# Patient Record
Sex: Male | Born: 1985 | Race: White | Hispanic: No | Marital: Married | Smoking: Never smoker
Health system: Southern US, Community
[De-identification: ages and names within clinical notes are randomized; demographics above are authoritative.]

---

## 2003-03-06 ENCOUNTER — Ambulatory Visit (HOSPITAL_COMMUNITY): Admission: RE | Admit: 2003-03-06 | Discharge: 2003-03-06 | Payer: Self-pay | Admitting: Family Medicine

## 2003-03-06 ENCOUNTER — Encounter: Payer: Self-pay | Admitting: Family Medicine

## 2010-08-01 ENCOUNTER — Emergency Department (HOSPITAL_COMMUNITY)
Admission: EM | Admit: 2010-08-01 | Discharge: 2010-08-02 | Payer: Self-pay | Source: Home / Self Care | Admitting: Emergency Medicine

## 2011-02-04 LAB — COMPREHENSIVE METABOLIC PANEL
Albumin: 4.4 g/dL (ref 3.5–5.2)
Alkaline Phosphatase: 73 U/L (ref 39–117)
BUN: 15 mg/dL (ref 6–23)
Creatinine, Ser: 1.4 mg/dL (ref 0.4–1.5)
Glucose, Bld: 106 mg/dL — ABNORMAL HIGH (ref 70–99)
Potassium: 3.6 mEq/L (ref 3.5–5.1)
Total Bilirubin: 0.5 mg/dL (ref 0.3–1.2)
Total Protein: 6.9 g/dL (ref 6.0–8.3)

## 2011-02-04 LAB — DIFFERENTIAL
Basophils Absolute: 0.1 10*3/uL (ref 0.0–0.1)
Basophils Relative: 0 % (ref 0–1)
Lymphocytes Relative: 18 % (ref 12–46)
Neutro Abs: 9.3 10*3/uL — ABNORMAL HIGH (ref 1.7–7.7)
Neutrophils Relative %: 69 % (ref 43–77)

## 2011-02-04 LAB — CBC
HCT: 44.4 % (ref 39.0–52.0)
MCH: 31 pg (ref 26.0–34.0)
MCV: 88.2 fL (ref 78.0–100.0)
Platelets: 198 10*3/uL (ref 150–400)
RDW: 12.5 % (ref 11.5–15.5)
WBC: 13.5 10*3/uL — ABNORMAL HIGH (ref 4.0–10.5)

## 2011-02-04 LAB — URINALYSIS, ROUTINE W REFLEX MICROSCOPIC
Bilirubin Urine: NEGATIVE
Glucose, UA: NEGATIVE mg/dL
Hgb urine dipstick: NEGATIVE
Ketones, ur: NEGATIVE mg/dL
Protein, ur: NEGATIVE mg/dL
Urobilinogen, UA: 0.2 mg/dL (ref 0.0–1.0)

## 2012-10-23 ENCOUNTER — Ambulatory Visit: Payer: Self-pay | Admitting: Internal Medicine

## 2012-10-30 ENCOUNTER — Ambulatory Visit: Payer: Self-pay | Admitting: Internal Medicine

## 2013-11-17 ENCOUNTER — Emergency Department (HOSPITAL_BASED_OUTPATIENT_CLINIC_OR_DEPARTMENT_OTHER)
Admission: EM | Admit: 2013-11-17 | Discharge: 2013-11-17 | Disposition: A | Discharge status: Home / Self Care | Payer: BC Managed Care – PPO | Attending: Emergency Medicine | Admitting: Emergency Medicine

## 2013-11-17 ENCOUNTER — Encounter (HOSPITAL_BASED_OUTPATIENT_CLINIC_OR_DEPARTMENT_OTHER): Payer: Self-pay | Admitting: Emergency Medicine

## 2013-11-17 DIAGNOSIS — Y929 Unspecified place or not applicable: Secondary | ICD-10-CM | POA: Insufficient documentation

## 2013-11-17 DIAGNOSIS — Z79899 Other long term (current) drug therapy: Secondary | ICD-10-CM | POA: Insufficient documentation

## 2013-11-17 DIAGNOSIS — Y990 Civilian activity done for income or pay: Secondary | ICD-10-CM | POA: Insufficient documentation

## 2013-11-17 DIAGNOSIS — S61209A Unspecified open wound of unspecified finger without damage to nail, initial encounter: Secondary | ICD-10-CM | POA: Insufficient documentation

## 2013-11-17 DIAGNOSIS — W260XXA Contact with knife, initial encounter: Secondary | ICD-10-CM | POA: Insufficient documentation

## 2013-11-17 DIAGNOSIS — S61219A Laceration without foreign body of unspecified finger without damage to nail, initial encounter: Secondary | ICD-10-CM

## 2013-11-17 DIAGNOSIS — Z880 Allergy status to penicillin: Secondary | ICD-10-CM | POA: Insufficient documentation

## 2013-11-17 DIAGNOSIS — Y9389 Activity, other specified: Secondary | ICD-10-CM | POA: Insufficient documentation

## 2013-11-17 MED ORDER — BACITRACIN 500 UNIT/GM EX OINT
1.0000 "application " | TOPICAL_OINTMENT | Freq: Two times a day (BID) | CUTANEOUS | Status: DC
  Administered 2013-11-17: 1 via TOPICAL
  Filled 2013-11-17: qty 0.9

## 2013-11-17 NOTE — ED Notes (Signed)
Pt works for [employer] and cut left index finger with a pocket knife. CMS intact- states he does need UDS for worker's comp

## 2013-11-17 NOTE — ED Provider Notes (Signed)
CSN: [id number]     Arrival date & time 11/17/13  1827 History   First MD Initiated Contact with Patient 11/17/13 2130     Chief Complaint  Patient presents with  . Extremity Laceration   (Consider location/radiation/quality/duration/timing/severity/associated sxs/prior Treatment) Patient is a 27 y.o. male presenting with skin laceration.  Laceration Location:  Finger Finger laceration location:  L index finger Length (cm):  3 cm Depth:  Through dermis Quality: straight   Time since incident: just prior to arrival to the ED. Laceration mechanism:  Knife Foreign body present:  No foreign bodies Relieved by:  Pressure Worsened by:  Movement Tetanus status:  Up to date  .namae is a 27 y.o. male who presents to the ED with a laceration to the left index finger. He was using his pocket knife to cut a wire and cut his finger.   History reviewed. No pertinent past medical history. History reviewed. No pertinent past surgical history. No family history on file. History  Substance Use Topics  . Smoking status: Never Smoker   . Smokeless tobacco: Never Used  . Alcohol Use: 0.6 oz/week    1 Cans of beer per week    Review of Systems  Allergies  Amoxicillin  Home Medications   Current Outpatient Rx  Name  Route  Sig  Dispense  Refill  . divalproex (DEPAKOTE) 500 MG DR tablet   Oral   Take 500 mg by mouth 3 (three) times daily.         . meloxicam (MOBIC) 15 MG tablet   Oral   Take 15 mg by mouth as needed for pain.          BP 137/72  Pulse 73  Temp(Src) 98.7 F (37.1 C) (Oral)  Resp 20  Ht 5\' 8"  (1.727 m)  Wt 220 lb (99.791 kg)  BMI 33.46 kg/m2  SpO2 98% Physical Exam  Nursing note and vitals reviewed. Constitutional: He is oriented to person, place, and time. He appears well-developed and well-nourished.  HENT:  Head: Normocephalic and atraumatic.  Eyes: EOM are normal.  Neck: Neck supple.  Cardiovascular: Normal rate.   Pulmonary/Chest: Effort normal.   Abdominal: Soft. There is no tenderness.  Musculoskeletal: Normal range of motion.       Left hand: He exhibits tenderness and laceration. He exhibits normal range of motion, normal capillary refill, no deformity and no swelling. Normal sensation noted. Normal strength noted.       Hands: Neurological: He is alert and oriented to person, place, and time. No cranial nerve deficit.  Skin: Skin is warm and dry.  Psychiatric: He has a normal mood and affect. His behavior is normal.    ED Course  Procedures LACERATION REPAIR Performed by: NEESE,HOPE Authorized by: NEESE,HOPE Consent: Verbal consent obtained. Risks and benefits: risks, benefits and alternatives were discussed Consent given by: patient Patient identity confirmed: provided demographic data Prepped and Draped in normal sterile fashion Wound explored  Laceration Location: left index finger  Laceration Length: 3cm  No Foreign Bodies seen or palpated  Anesthesia: local infiltration  Local anesthetic: lidocaine 1% without epinephrine  Anesthetic total: 3 ml  Irrigation method: syringe Amount of cleaning: standard  Skin closure: 5-0 prolene  Number of sutures: 4  Technique: interrupted  Patient tolerance: Patient tolerated the procedure well with no immediate complications.  MDM  27 y.o. male with laceration to his left index finger. Injury from his pocket knife. Laceration repaired without difficulty. Up to date on tetanus. He  will follow up with his PCP in 7 days for suture removal or return here for any problems.  Discussed with the patient and all questioned fully answered.    Medication List    ASK your doctor about these medications       divalproex 500 MG DR tablet  Commonly known as:  DEPAKOTE  Take 500 mg by mouth 3 (three) times daily.     meloxicam 15 MG tablet  Commonly known as:  MOBIC  Take 15 mg by mouth as needed for pain.          Hope Orlene Och, NP 11/21/13 1500

## 2013-11-26 NOTE — ED Provider Notes (Signed)
Medical screening examination/treatment/procedure(s) were performed by non-physician practitioner and as supervising physician I was immediately available for consultation/collaboration.  EKG Interpretation   None         Gavin PoundMichael Y. Amaiya Scruton, MD 11/26/13 1255

## 2014-07-22 ENCOUNTER — Ambulatory Visit (INDEPENDENT_AMBULATORY_CARE_PROVIDER_SITE_OTHER): Payer: BC Managed Care – PPO | Admitting: Internal Medicine

## 2014-07-22 ENCOUNTER — Ambulatory Visit (INDEPENDENT_AMBULATORY_CARE_PROVIDER_SITE_OTHER): Payer: BC Managed Care – PPO

## 2014-07-22 VITALS — BP 122/64 | HR 107 | Temp 102.6°F | Resp 17 | Ht 68.5 in | Wt 214.0 lb

## 2014-07-22 DIAGNOSIS — R52 Pain, unspecified: Secondary | ICD-10-CM

## 2014-07-22 DIAGNOSIS — R509 Fever, unspecified: Secondary | ICD-10-CM

## 2014-07-22 DIAGNOSIS — J029 Acute pharyngitis, unspecified: Secondary | ICD-10-CM

## 2014-07-22 DIAGNOSIS — R109 Unspecified abdominal pain: Secondary | ICD-10-CM

## 2014-07-22 DIAGNOSIS — R10A1 Flank pain, right side: Secondary | ICD-10-CM

## 2014-07-22 LAB — POCT CBC
Granulocyte percent: 82.6 %G — AB (ref 37–80)
HCT, POC: 46.7 % (ref 43.5–53.7)
Hemoglobin: 15.2 g/dL (ref 14.1–18.1)
Lymph, poc: 0.9 (ref 0.6–3.4)
MCH, POC: 28.9 pg (ref 27–31.2)
MCHC: 32.6 g/dL (ref 31.8–35.4)
MCV: 88.6 fL (ref 80–97)
MID (CBC): 1.2 — AB (ref 0–0.9)
MPV: 7.7 fL (ref 0–99.8)
PLATELET COUNT, POC: 169 10*3/uL (ref 142–424)
POC Granulocyte: 10.2 — AB (ref 2–6.9)
POC LYMPH %: 7.3 % — AB (ref 10–50)
POC MID %: 10.1 % (ref 0–12)
RBC: 5.27 M/uL (ref 4.69–6.13)
RDW, POC: 12.9 %
WBC: 12.3 10*3/uL — AB (ref 4.6–10.2)

## 2014-07-22 LAB — POCT URINALYSIS DIPSTICK
Bilirubin, UA: NEGATIVE
Blood, UA: NEGATIVE
Glucose, UA: NEGATIVE
Ketones, UA: NEGATIVE
LEUKOCYTES UA: NEGATIVE
NITRITE UA: NEGATIVE
PROTEIN UA: NEGATIVE
Spec Grav, UA: 1.015
Urobilinogen, UA: 0.2
pH, UA: 5.5

## 2014-07-22 LAB — COMPREHENSIVE METABOLIC PANEL
ALK PHOS: 86 U/L (ref 39–117)
ALT: 23 U/L (ref 0–53)
AST: 22 U/L (ref 0–37)
Albumin: 4.4 g/dL (ref 3.5–5.2)
BILIRUBIN TOTAL: 0.5 mg/dL (ref 0.2–1.2)
BUN: 12 mg/dL (ref 6–23)
CO2: 24 mEq/L (ref 19–32)
CREATININE: 1 mg/dL (ref 0.50–1.35)
Calcium: 9.6 mg/dL (ref 8.4–10.5)
Chloride: 100 mEq/L (ref 96–112)
Glucose, Bld: 117 mg/dL — ABNORMAL HIGH (ref 70–99)
Potassium: 4.3 mEq/L (ref 3.5–5.3)
Sodium: 134 mEq/L — ABNORMAL LOW (ref 135–145)
Total Protein: 6.5 g/dL (ref 6.0–8.3)

## 2014-07-22 LAB — POCT UA - MICROSCOPIC ONLY
Bacteria, U Microscopic: NEGATIVE
Casts, Ur, LPF, POC: NEGATIVE
Crystals, Ur, HPF, POC: NEGATIVE
Mucus, UA: NEGATIVE
RBC, urine, microscopic: NEGATIVE
YEAST UA: NEGATIVE

## 2014-07-22 LAB — LIPASE: Lipase: 18 U/L (ref 0–75)

## 2014-07-22 LAB — POCT RAPID STREP A (OFFICE): Rapid Strep A Screen: NEGATIVE

## 2014-07-22 MED ORDER — CEFTRIAXONE SODIUM 1 G IJ SOLR
1.0000 g | Freq: Once | INTRAMUSCULAR | Status: AC
  Administered 2014-07-22: 1 g via INTRAMUSCULAR

## 2014-07-22 MED ORDER — DOXYCYCLINE HYCLATE 100 MG PO TABS: 100.0000 mg | ORAL_TABLET | Freq: Two times a day (BID) | ORAL | Status: AC

## 2014-07-22 NOTE — Progress Notes (Signed)
Subjective:    Patient ID: Michael Spence, male    DOB: 07-24-1986, 28 y.o.   MRN: [id number]  HPI 28 year old male here for headache, fatigue, chills, and fever. Has been having right sided mid back pain for the past couple weeks; has become worse within last couple days. Around 9:30 this morning he started running a fever and having chills. He feels weak. His headache started about two nights ago. It is hard for him to take a deep breath. His throat started hurting today. No cough. No problems with urination. Body aches for past couple days. Has not been around anyone sick. No swollen joints. No aching joints.  No ticks seen but has a dog. Has not been in parks and woods, not hunting or fishing. No exposure to any one sick.  Review of Systems  Constitutional: Positive for fever and fatigue.  HENT: Positive for sore throat.   Respiratory: Positive for shortness of breath.   Cardiovascular: Negative.   Gastrointestinal: Positive for abdominal pain. Negative for nausea, diarrhea and abdominal distention.  Endocrine: Negative.   Genitourinary: Positive for flank pain. Negative for urgency, frequency, hematuria, decreased urine volume and testicular pain.  Musculoskeletal: Negative.   Skin: Negative.   Allergic/Immunologic: Negative.   Neurological: Positive for headaches. Negative for dizziness, tremors, speech difficulty and weakness.  Hematological: Negative.   Psychiatric/Behavioral: Negative.        Objective:   Physical Exam  Constitutional: He is oriented to person, place, and time. He appears well-developed and well-nourished. No distress.  HENT:  Head: Normocephalic.  Right Ear: External ear normal.  Left Ear: External ear normal.  Nose: Nose normal.  Mouth/Throat: Oropharynx is clear and moist.  Eyes: Conjunctivae and EOM are normal. Pupils are equal, round, and reactive to light. No scleral icterus.  Neck: Normal range of motion. Neck supple. No tracheal deviation  present. No thyromegaly present.  Cardiovascular: Normal rate, regular rhythm, normal heart sounds and intact distal pulses.   Pulmonary/Chest: Effort normal and breath sounds normal.  Abdominal: Soft. Bowel sounds are normal. He exhibits no mass. There is tenderness. There is no rebound and no guarding.  Genitourinary: Penis normal.  Musculoskeletal: Normal range of motion.  Lymphadenopathy:    He has cervical adenopathy.  Neurological: He is alert and oriented to person, place, and time. No cranial nerve deficit. He exhibits normal muscle tone. Coordination normal.  Skin: No rash noted.  Psychiatric: He has a normal mood and affect. His behavior is normal. Judgment and thought content normal.     Results for orders placed in visit on 07/22/14  POCT CBC      Result Value Ref Range   WBC 12.3 (*) 4.6 - 10.2 K/uL   Lymph, poc 0.9  0.6 - 3.4   POC LYMPH PERCENT 7.3 (*) 10 - 50 %L   MID (cbc) 1.2 (*) 0 - 0.9   POC MID % 10.1  0 - 12 %M   POC Granulocyte 10.2 (*) 2 - 6.9   Granulocyte percent 82.6 (*) 37 - 80 %G   RBC 5.27  4.69 - 6.13 M/uL   Hemoglobin 15.2  14.1 - 18.1 g/dL   HCT, POC 40.9  81.1 - 53.7 %   MCV 88.6  80 - 97 fL   MCH, POC 28.9  27 - 31.2 pg   MCHC 32.6  31.8 - 35.4 g/dL   RDW, POC 91.4     Platelet Count, POC 169  142 - 424  K/uL   MPV 7.7  0 - 99.8 fL  POCT RAPID STREP A (OFFICE)      Result Value Ref Range   Rapid Strep A Screen Negative  Negative  POCT UA - MICROSCOPIC ONLY      Result Value Ref Range   WBC, Ur, HPF, POC 0-1     RBC, urine, microscopic neg     Bacteria, U Microscopic neg     Mucus, UA neg     Epithelial cells, urine per micros 0-1     Crystals, Ur, HPF, POC neg     Casts, Ur, LPF, POC neg     Yeast, UA neg    POCT URINALYSIS DIPSTICK      Result Value Ref Range   Color, UA dark yellow     Clarity, UA clear     Glucose, UA neg     Bilirubin, UA neg     Ketones, UA neg     Spec Grav, UA 1.015     Blood, UA neg     pH, UA 5.5      Protein, UA neg     Urobilinogen, UA 0.2     Nitrite, UA neg     Leukocytes, UA Negative     [facility] Policy for Prescribing Controlled Substances (Revised 09/2012) 1. Prescriptions for controlled substances will be filled by ONE provider at [facility] with whom you have established and developed a plan for your care, including follow-up. 2. You are encouraged to schedule an appointment with your prescriber at our appointment center for follow-up visits whenever possible. 3. If you request a prescription for the controlled substance while at [facility] for an acute problem (with someone other than your regular prescriber), you MAY be given a ONE-TIME prescription for a 30-day supply of the controlled substance, to allow time for you to return to see your regular prescriber for additional prescriptions.  [facility] reading (PRIMARY) by  Dr.Normal CXR Dr. Perrin Maltese        Assessment & Plan:  FeverHA/Body aches cause unclear Rocephin 1 g/Doxycycline  bid Return 24-48 hrs reasess

## 2014-07-22 NOTE — Patient Instructions (Signed)
Ehrlichiosis and Anaplasmosis Ehrlichiosis and anaplasmosis are diseases caused by bacteria and carried by ticks. Other names for these infections are:  Human monocytic ehrlichiosis (HME).  Human granulocytotropic anaplasmosis (HGA). HME mostly occurs in the [place] and [place], where the lone star tick lives. However, infections have occurred in 72 states. HGA infections are limited to fewer geographic locations. Most cases are reported from [place], [place], [place], [place], and [place]. This distribution is almost identical to that of Lyme disease because of the shared species of ixodid ticks (wood ticks, deer ticks). CAUSES   HME is caused by Ehrlichia chaffeensis and other closely related ehrlichia bacteria.  HGA is caused by the bacteria Anaplasma phagocytophilum. An infected adult tick transmits the infection by biting a human. Once a tick gains access to human skin, it generally climbs upward until it reaches a more protected area. This is often the back of the knee, groin, navel, armpit, ears, or nape of the neck. It then begins the slow process of embedding itself in the skin. Adult ticks are active during warmer times of the year. For this reason, most infections occur between late spring and early fall. SYMPTOMS  Many infected people have no symptoms. For those with symptoms, HME and HGA cause similar illnesses. Symptoms typically begin 1 week or more after a tick bite and may include:  Fever.  Headache.  Chills or shaking.  Fatigue.  Muscle pain.  Nausea.  Loss of appetite.  Vomiting.  Diarrhea. Symptoms commonly last for 1 to 3 weeks if a patient is not diagnosed or not treated with an antibiotic. Extremely severe disease is rare, but occasional deaths from infection have been reported. DIAGNOSIS  Diagnosis is suggested by a history of tick bites or potential exposure to ticks. Blood tests may show abnormalities of liver  function and low counts of white blood cells and platelets. To confirm the diagnosis, the bacteria must be found in a smear of blood on a microscope slide or during testing of the liquid part of your blood (serum). TREATMENT  Treatment with an antibiotic is almost always effective in eliminating symptoms within a couple days and curing the infection.  PREVENTION Ticks prefer to hide in shady, moist ground. However, they can often be found above the ground clinging to tall grass, brush, shrubs, and low tree branches. They also inhabit lawns and gardens, especially at the edges of woodlands and around old stone walls. Within the normal geographic areas where HME and HGA occur, no vegetated area can be considered completely free of infected ticks. In tick-infested areas, the best precaution against infection is to avoid contact with soil, leaf litter, and vegetation as much as possible. Campers, hikers, field workers, and others who spend time in wooded, brushy, or tall grassy areas can avoid exposure to ticks by using the following precautions:  Wear light-colored clothing with a tight weave to spot ticks more easily and prevent contact with the skin.  Wear long pants tucked into socks, long-sleeve shirts tucked into pants, and enclosed shoes or boots.  Use insect repellent. Spray clothes with insect repellent containing either DEET or permethrin. Only DEET can be used on exposed skin. Make sure to follow the manufacturer's directions carefully.  Wear a hat and keep long hair pulled back.  Stay on cleared, well-worn trails whenever possible.  Check yourself and others frequently for the presence of ticks on clothes. If you find one tick, there may be more. Check thoroughly.  Remove clothes after leaving tick-infested areas and, if possible, wash them to eliminate any unseen ticks. Check yourself, your children, and any pets from head to toe for the presence of ticks.  When attached ticks are found,  you can greatly reduce your chances of getting HME and HGA if you remove them as soon as possible. Use a tweezers to grab hold of the tick by its mouth parts and pull it off.  Shower and shampoo after possible exposure to ticks. HOME CARE INSTRUCTIONS Take your antibiotics as directed. Finish them even if you start to feel better. SEEK MEDICAL CARE IF:   You have a fever.  You develop a headache.  You develop fatigue.  You develop muscle pain.  You develop nausea, vomiting, or diarrhea. MAKE SURE YOU:  Understand these instructions.  Will watch your condition.  Will get help right away if you are not doing well or get worse. Document Released: 11/05/2000 Document Revised: 03/25/2014 Document Reviewed: 05/14/2011 [facility] Patient Information 2015 [place], [place]. This information is not intended to replace advice given to you by your health care provider. Make sure you discuss any questions you have with your health care provider. [place] Spotted Fever [place] Spotted Fever (RMSF) is the oldest known tick-borne disease of people in the [place]. This disease was named because it was first described among people in the [facility] area who had an illness characterized by a rash with red-purple-black spots. This disease is caused by a rickettsia (Rickettsia rickettsii), a bacteria carried by the tick. The [facility] wood tick and the American dog tick acquire and transmit the RMSF bacteria (pictures NOT actual size). When a larval, nymphal, or adult tick feeds on an infected rodent or larger animal, the tick can become infected. Infected adult ticks then feed on people who may then get RMSF. The tick transmits the disease to humans during a prolonged period of feeding that lasts many hours, days, or even a couple weeks. The bite is painless and frequently goes unnoticed. An infected male tick may also pass the rickettsial bacteria to her eggs that then may mature  to be infected adult ticks. The rickettsia that causes RMSF can also get into a person's body through damaged skin. A tick bite is not necessary. People can get RMSF if they crush a tick and get its blood or body fluids on their skin through a small cut or sore.  DIAGNOSIS Diagnosis is made by laboratory tests.  TREATMENT Treatment is with antibiotics (medications that kill rickettsia and other bacteria). Immediate treatment usually prevents death. GEOGRAPHIC RANGE This disease was reported only in the [facility] until 1931. RMSF has more recently been described among individuals in all states except [place], [place], and [place]. The highest reported incidences of RMSF now occur among residents of [place], [place], [place], and the [place]. TIME OF YEAR  Most cases are diagnosed during late spring and summer when ticks are most active. However, especially in the warmer [place] states, a few cases occur during the winter. SYMPTOMS   Symptoms of RMSF begin from 2 to 14 days after a tick bite. The most common early symptoms are fever, muscle aches, and headache followed by nausea (feeling sick to your stomach) or vomiting.  The RMSF rash is typically delayed until 3 or more days after symptom onset, and eventually develops in 9 of 10 infected patients by the fifth day of illness. If the disease is not treated it can cause death. If  you get a fever, headache, muscle aches, rash, nausea, or vomiting within 2 weeks of a possible tick bite or exposure, you should see your caregiver immediately. PREVENTION Ticks prefer to hide in shady, moist ground litter. They can often be found above the ground clinging to tall grass, brush, shrubs and low tree branches. They also inhabit lawns and gardens, especially at the edges of woodlands and around old stone walls. Within the areas where ticks generally live, no naturally vegetated area can be considered completely free of infected ticks. The best  precaution against RMSF is to avoid contact with soil, leaf litter, and vegetation as much as possible in tick-infested areas. For those who enjoy gardening or walking in their yards, clear brush and mow tall grass around houses and at the edges of gardens. This may help reduce the tick population in the immediate area. Applications of chemical insecticides by a licensed professional in the spring (late May) and fall (September) will also control ticks, especially in heavily infested areas. Treatment will never get rid of all the ticks. Getting rid of small animal populations that host ticks will also decrease the tick population. When working in the garden, [employer], or handling soil and vegetation, wear light-colored protective clothing and gloves. Spot-check often to prevent ticks from reaching the skin. Ticks cannot jump or fly. They will not drop from an above-ground perch onto a passing animal. Once a tick gains access to human skin it climbs upward until it reaches a more protected area. For example, the back of the knee, groin, navel, armpit, ears, or nape of the neck. It then begins the slow process of embedding itself in the skin. Campers, hikers, field workers, and others who spend time in wooded, brushy, or tall grassy areas can avoid exposure to ticks by using the following precautions:  Wear light-colored clothing with a tight weave to spot ticks more easily and prevent contact with the skin.  Wear long pants tucked into socks, long-sleeved shirts tucked into pants and enclosed shoes or boots along with insect repellent.  Spray clothes with insect repellent containing either DEET or Permethrin. Only DEET can be used on exposed skin. Follow the manufacturer's directions carefully.  Wear a hat and keep long hair pulled back.  Stay on cleared, well-worn trails whenever possible.  Spot-check yourself and others often for the presence of ticks on clothes. If you find one, there are likely  to be others. Check thoroughly.  Remove clothes after leaving tick-infested areas. If possible, wash them to eliminate any unseen ticks. Check yourself, your children and any pets from head to toe for the presence of ticks.  Shower and shampoo. You can greatly reduce your chances of contracting RMSF if you remove attached ticks as soon as possible. Regular checks of the body, including all body sites covered by hair (head, armpits, genitals), allow removal of the tick before rickettsial transmission. To remove an attached tick, use a forceps or tweezers to detach the intact tick without leaving mouth parts in the skin. The tick bite wound should be cleansed after tick removal. Remember the most common symptoms of RMSF are fever, muscle aches, headache, and nausea or vomiting with a later onset of rash. If you get these symptoms after a tick bite and while living in an area where RMSF is found, RMSF should be suspected. If the disease is not treated, it can cause death. See your caregiver immediately if you get these symptoms. Do this even if not  aware of a tick bite. Document Released: 02/20/2001 Document Revised: 03/25/2014 Document Reviewed: 10/13/2009 [facility] Patient Information 2015 [place], [place]. This information is not intended to replace advice given to you by your health care provider. Make sure you discuss any questions you have with your health care provider. Tick Bite Information Ticks are insects that attach themselves to the skin and draw blood for food. There are various types of ticks. Common types include wood ticks and deer ticks. Most ticks live in shrubs and grassy areas. Ticks can climb onto your body when you make contact with leaves or grass where the tick is waiting. The most common places on the body for ticks to attach themselves are the scalp, neck, armpits, waist, and groin. Most tick bites are harmless, but sometimes ticks carry germs that cause diseases. These germs can be  spread to a person during the tick's feeding process. The chance of a disease spreading through a tick bite depends on:   The type of tick.  Time of year.   How long the tick is attached.   Geographic location.  HOW CAN YOU PREVENT TICK BITES? Take these steps to help prevent tick bites when you are outdoors:  Wear protective clothing. Long sleeves and long pants are best.   Wear white clothes so you can see ticks more easily.  Tuck your pant legs into your socks.   If walking on a trail, stay in the middle of the trail to avoid brushing against bushes.  Avoid walking through areas with long grass.  Put insect repellent on all exposed skin and along boot tops, pant legs, and sleeve cuffs.   Check clothing, hair, and skin repeatedly and before going inside.   Brush off any ticks that are not attached.  Take a shower or bath as soon as possible after being outdoors.  WHAT IS THE PROPER WAY TO REMOVE A TICK? Ticks should be removed as soon as possible to help prevent diseases caused by tick bites. 1. If latex gloves are available, put them on before trying to remove a tick.  2. Using fine-point tweezers, grasp the tick as close to the skin as possible. You may also use curved forceps or a tick removal tool. Grasp the tick as close to its head as possible. Avoid grasping the tick on its body. 3. Pull gently with steady upward pressure until the tick lets go. Do not twist the tick or jerk it suddenly. This may break off the tick's head or mouth parts. 4. Do not squeeze or crush the tick's body. This could force disease-carrying fluids from the tick into your body.  5. After the tick is removed, wash the bite area and your hands with soap and water or other disinfectant such as alcohol. 6. Apply a small amount of antiseptic cream or ointment to the bite site.  7. Wash and disinfect any instruments that were used.  Do not try to remove a tick by applying a hot match,  petroleum jelly, or fingernail polish to the tick. These methods do not work and may increase the chances of disease being spread from the tick bite.  WHEN SHOULD YOU SEEK MEDICAL CARE? Contact your health care provider if you are unable to remove a tick from your skin or if a part of the tick breaks off and is stuck in the skin.  After a tick bite, you need to be aware of signs and symptoms that could be related to diseases spread by  ticks. Contact your health care provider if you develop any of the following in the days or weeks after the tick bite:  Unexplained fever.  Rash. A circular rash that appears days or weeks after the tick bite may indicate the possibility of Lyme disease. The rash may resemble a target with a bull's-eye and may occur at a different part of your body than the tick bite.  Redness and swelling in the area of the tick bite.   Tender, swollen lymph glands.   Diarrhea.   Weight loss.   Cough.   Fatigue.   Muscle, joint, or bone pain.   Abdominal pain.   Headache.   Lethargy or a change in your level of consciousness.  Difficulty walking or moving your legs.   Numbness in the legs.   Paralysis.  Shortness of breath.   Confusion.   Repeated vomiting.  Document Released: 11/05/2000 Document Revised: 08/29/2013 Document Reviewed: 04/18/2013 [facility] Patient Information 2015 [place], [place]. This information is not intended to replace advice given to you by your health care provider. Make sure you discuss any questions you have with your health care provider.

## 2014-07-24 LAB — ROCKY MTN SPOTTED FVR AB, IGM-BLOOD: ROCKY MTN SPOTTED FEVER, IGM: 0.48 IV

## 2014-07-28 ENCOUNTER — Encounter: Payer: Self-pay | Admitting: Radiology

## 2014-07-29 ENCOUNTER — Ambulatory Visit (INDEPENDENT_AMBULATORY_CARE_PROVIDER_SITE_OTHER): Payer: BC Managed Care – PPO | Admitting: Family Medicine

## 2014-07-29 VITALS — BP 126/70 | HR 70 | Temp 97.5°F | Resp 16 | Ht 68.5 in | Wt 213.6 lb

## 2014-07-29 DIAGNOSIS — J029 Acute pharyngitis, unspecified: Secondary | ICD-10-CM

## 2014-07-29 DIAGNOSIS — B085 Enteroviral vesicular pharyngitis: Secondary | ICD-10-CM

## 2014-07-29 MED ORDER — MAGIC MOUTHWASH W/LIDOCAINE: 5.0000 mL | Freq: Three times a day (TID) | ORAL | Status: DC | PRN

## 2014-07-29 NOTE — Progress Notes (Signed)
° °  Subjective:  This chart was scribed for Elvina Sidle, MD by Carl Best, Medical Scribe. This patient was seen in Room 4 and the patient's care was started at 4:29 PM.   Patient ID: Michael Spence, male    DOB: October 20, 1986, 28 y.o.   MRN: [id number]  HPI HPI Comments: Michael Spence is a 28 y.o. male who presents to the [facility] complaining of constant sore throat with some oral lacerations in the back of his throat that started 3 days ago.  He was treated by Dr. Perrin Maltese for [facility] Spotted Fever a week ago but did not experience any sore throat at that time.  He lists low-grade fever as an associated symptom.  He works as a Nutritional therapist.    History reviewed. No pertinent past medical history. History reviewed. No pertinent past surgical history. Family History  Problem Relation Age of Onset   Diabetes Father    Hyperlipidemia Father    History   Social History   Marital Status: Married    Spouse Name: N/A    Number of Children: N/A   Years of Education: N/A   Occupational History   Not on file.   Social History Main Topics   Smoking status: Never Smoker    Smokeless tobacco: Never Used   Alcohol Use: 1.2 oz/week    2 Cans of beer per week   Drug Use: No   Sexual Activity: No   Other Topics Concern   Not on file   Social History Narrative   No narrative on file   Allergies  Allergen Reactions   Amoxicillin Rash    Had mono and got amoxil rash with mono    Review of Systems  Constitutional: Positive for fever.  HENT: Positive for sore throat.      Objective:  Physical Exam  Nursing note and vitals reviewed. Constitutional: He is oriented to person, place, and time. He appears well-developed and well-nourished.  HENT:  Head: Normocephalic and atraumatic.  Right Ear: Tympanic membrane, external ear and ear canal normal.  Left Ear: Tympanic membrane, external ear and ear canal normal.  Nose: Nose normal.  Aphthous  bilaterally   Eyes: Conjunctivae and EOM are normal. Pupils are equal, round, and reactive to light.  Neck: Normal range of motion. Neck supple. No thyromegaly present.  Cardiovascular: Normal rate, regular rhythm and normal heart sounds.  Exam reveals no gallop and no friction rub.   No murmur heard. Pulmonary/Chest: Effort normal. No respiratory distress. He has no wheezes. He has no rales.  Musculoskeletal: Normal range of motion.  Lymphadenopathy:    He has no cervical adenopathy.  Neurological: He is alert and oriented to person, place, and time.  Skin: Skin is warm and dry.  Psychiatric: He has a normal mood and affect. His behavior is normal.     BP 126/70   Pulse 70   Temp(Src) 97.5 F (36.4 C) (Oral)   Resp 16   Ht 5' 8.5" (1.74 m)   Wt 213 lb 9.6 oz (96.888 kg)   BMI 32.00 kg/m2   SpO2 96% Assessment & Plan:  I personally performed the services described in this documentation, which was scribed in my presence. The recorded information has been reviewed and is accurate.  Aphthous pharyngitis - Plan: Alum & Mag Hydroxide-Simeth (MAGIC MOUTHWASH W/LIDOCAINE) SOLN  Acute pharyngitis, unspecified pharyngitis type  Signed, Elvina Sidle, MD

## 2014-07-30 ENCOUNTER — Telehealth: Payer: Self-pay

## 2014-07-30 DIAGNOSIS — B085 Enteroviral vesicular pharyngitis: Secondary | ICD-10-CM

## 2014-07-30 MED ORDER — MAGIC MOUTHWASH W/LIDOCAINE
5.0000 mL | Freq: Three times a day (TID) | ORAL | Status: AC | PRN
Start: 2014-07-30 — End: ?

## 2014-07-30 NOTE — Telephone Encounter (Signed)
Patient is needing [place] to resend the medicine he was given yesterday.  Patient went to the pharmacy to pick-up medicine and the pharmacy said they had not received yet from [place].     Pharmacy: [id number] - [place], [place] - [address]

## 2014-07-30 NOTE — Telephone Encounter (Signed)
Pharm called to ask if they can subst First Mouth Wash for MMW. Checked w/Sarah W who authorized the change.

## 2014-07-30 NOTE — Telephone Encounter (Signed)
Sent rx to pharmacy requested.

## 2015-10-31 IMAGING — CR DG CHEST 2V
2 series · 2 of 2 positions shown · non-contrast
Comparison: PA and lateral chest x-ray associated with a rib series
of March 13, 2013

CLINICAL DATA: Fever body aches sore throat and abdominal
discomfort.

EXAM:
CHEST  2 VIEW

[PA]
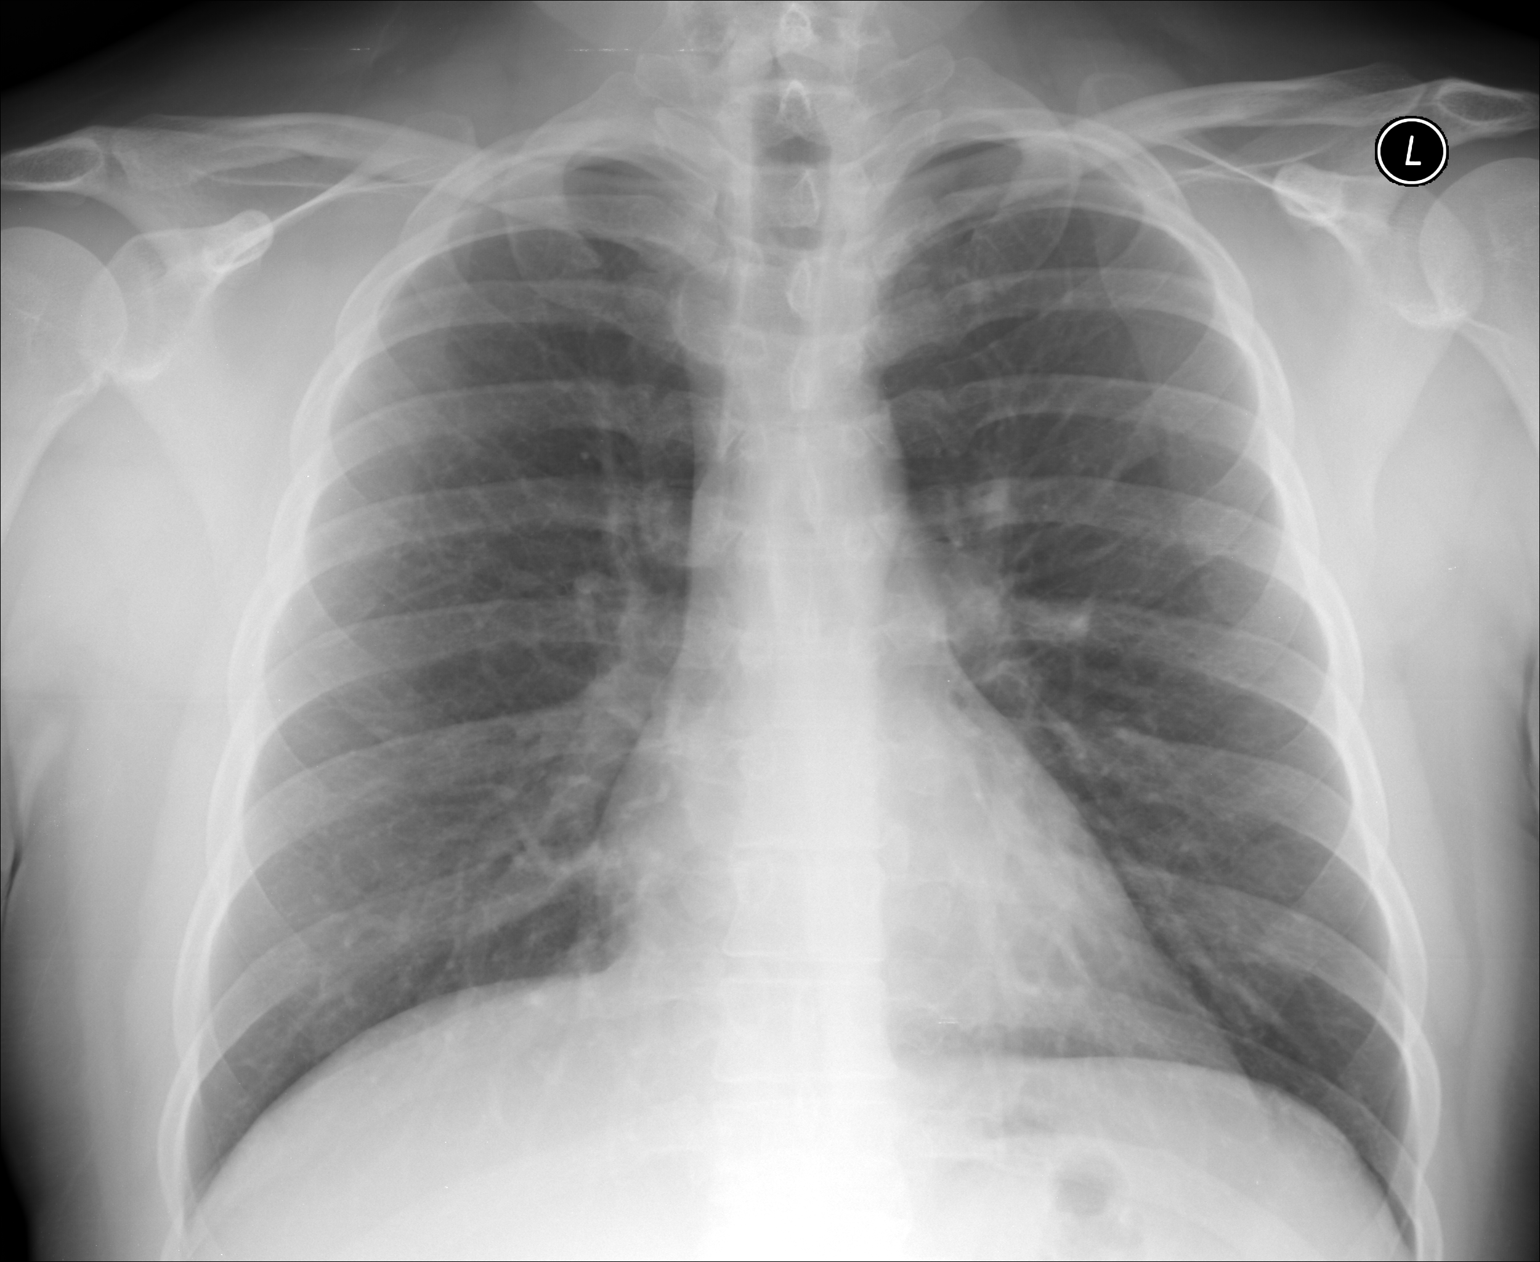

[lateral]
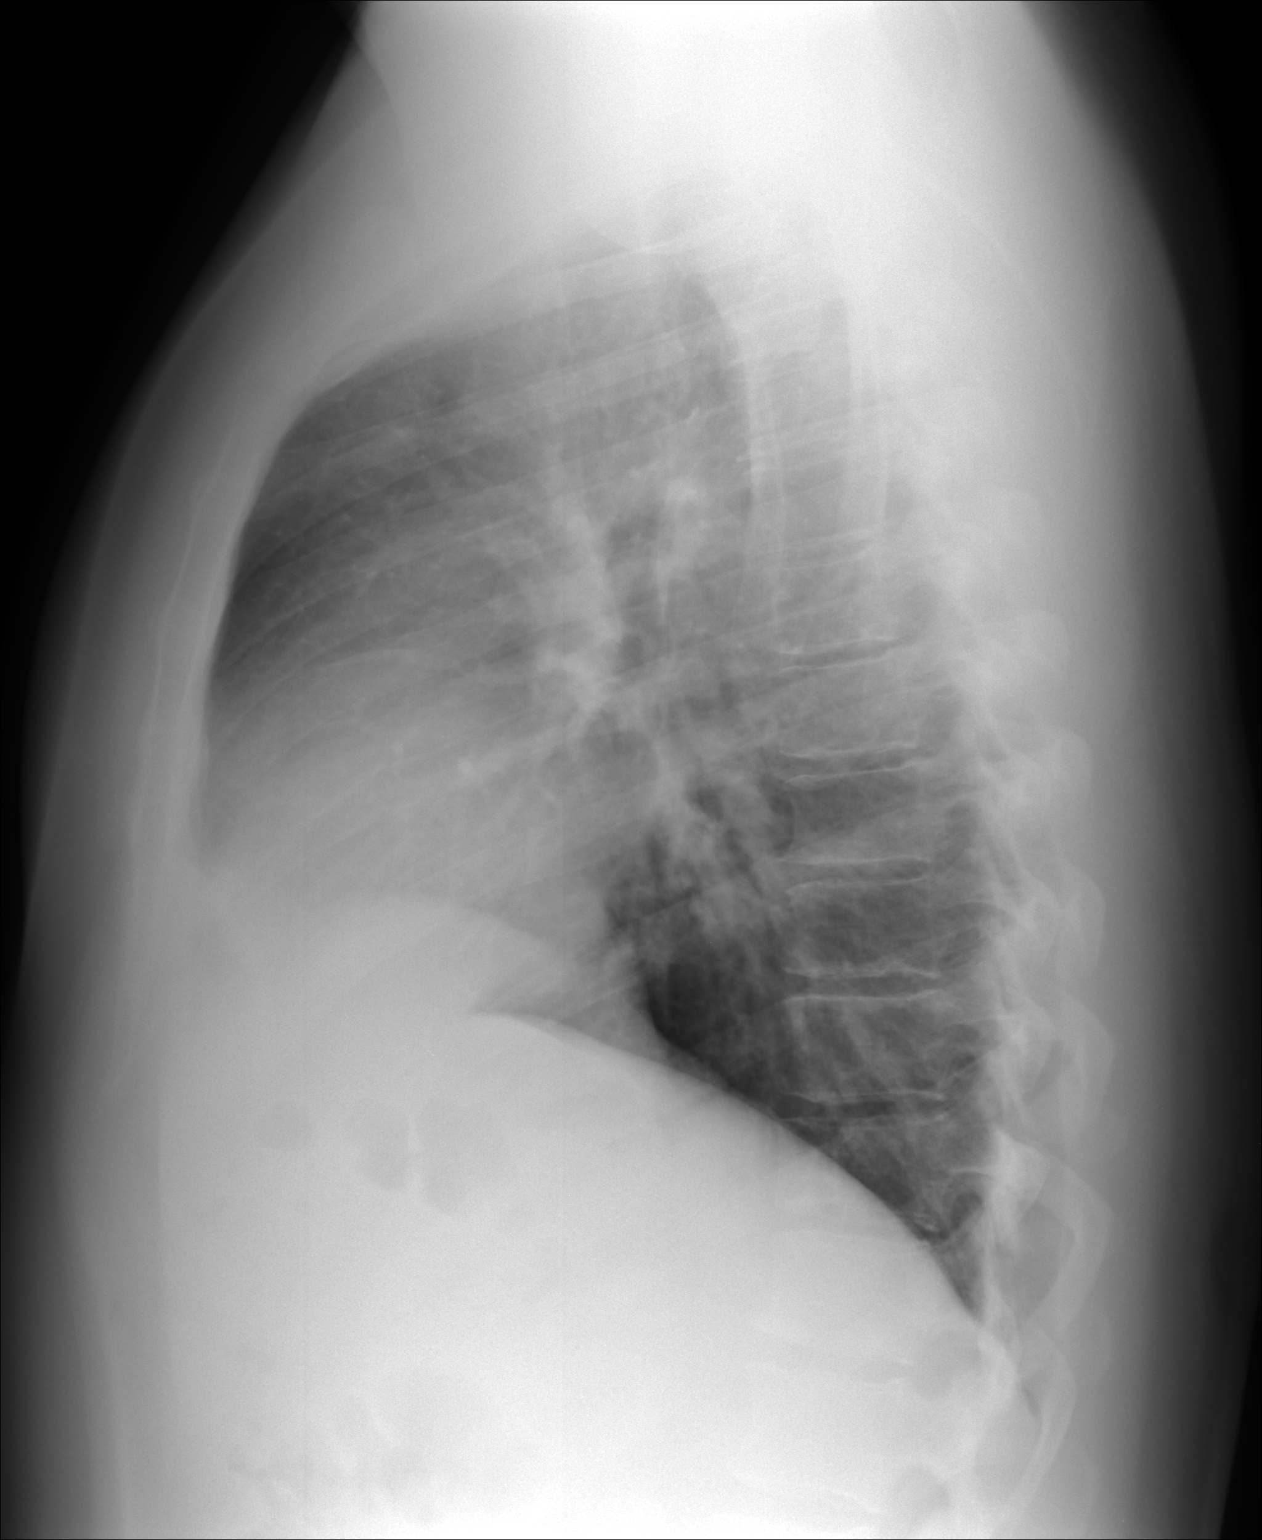

[2 of 2 positions shown; findings below may reference images not displayed]

FINDINGS: The lungs are adequately inflated. There is no focal infiltrate. The
heart and pulmonary vascularity are normal. The mediastinum is
normal in width. There is no pleural effusion. The bony thorax is
unremarkable.
IMPRESSION: There is no pneumonia nor other acute cardiopulmonary abnormality.
